# Patient Record
Sex: Male | Born: 1950 | Race: White | Hispanic: No | Marital: Married | Smoking: Never smoker
Health system: Southern US, Community
[De-identification: ages and names within clinical notes are randomized; demographics above are authoritative.]

## PROBLEM LIST (undated history)

## (undated) DIAGNOSIS — E119 Type 2 diabetes mellitus without complications: Secondary | ICD-10-CM

## (undated) DIAGNOSIS — T7840XA Allergy, unspecified, initial encounter: Secondary | ICD-10-CM

## (undated) DIAGNOSIS — I10 Essential (primary) hypertension: Secondary | ICD-10-CM

## (undated) DIAGNOSIS — J449 Chronic obstructive pulmonary disease, unspecified: Secondary | ICD-10-CM

## (undated) DIAGNOSIS — I509 Heart failure, unspecified: Secondary | ICD-10-CM

## (undated) DIAGNOSIS — M1990 Unspecified osteoarthritis, unspecified site: Secondary | ICD-10-CM

## (undated) DIAGNOSIS — Z5189 Encounter for other specified aftercare: Secondary | ICD-10-CM

## (undated) HISTORY — DX: Unspecified osteoarthritis, unspecified site: M19.90

## (undated) HISTORY — DX: Allergy, unspecified, initial encounter: T78.40XA

## (undated) HISTORY — PX: ROTATOR CUFF REPAIR: SHX139

## (undated) HISTORY — DX: Encounter for other specified aftercare: Z51.89

## (undated) HISTORY — DX: Heart failure, unspecified: I50.9

## (undated) HISTORY — DX: Chronic obstructive pulmonary disease, unspecified: J44.9

---

## 2000-04-03 ENCOUNTER — Ambulatory Visit (HOSPITAL_COMMUNITY): Admission: RE | Admit: 2000-04-03 | Discharge: 2000-04-03 | Payer: Self-pay | Admitting: Specialist

## 2000-04-03 ENCOUNTER — Encounter: Payer: Self-pay | Admitting: Specialist

## 2001-06-16 ENCOUNTER — Encounter: Admission: RE | Admit: 2001-06-16 | Discharge: 2001-09-14 | Payer: Self-pay | Admitting: Family Medicine

## 2005-12-03 ENCOUNTER — Encounter: Admission: RE | Admit: 2005-12-03 | Discharge: 2005-12-03 | Payer: Self-pay | Admitting: Family Medicine

## 2008-11-06 ENCOUNTER — Ambulatory Visit (HOSPITAL_COMMUNITY): Admission: RE | Admit: 2008-11-06 | Discharge: 2008-11-06 | Payer: Self-pay | Admitting: Urology

## 2009-02-21 ENCOUNTER — Encounter: Admission: RE | Admit: 2009-02-21 | Discharge: 2009-02-21 | Payer: Self-pay | Admitting: Family Medicine

## 2010-08-19 LAB — GLUCOSE, CAPILLARY: Glucose-Capillary: 167 mg/dL — ABNORMAL HIGH (ref 70–99)

## 2010-11-04 IMAGING — CR DG LUMBAR SPINE COMPLETE 4+V
5 series · 5 of 5 positions shown · non-contrast
Comparison: None

CLINICAL DATA: Low back and bilateral hip pain for several months,
no acute injury

LUMBAR SPINE - COMPLETE 4+ VIEW

[t l-spine a.p.]
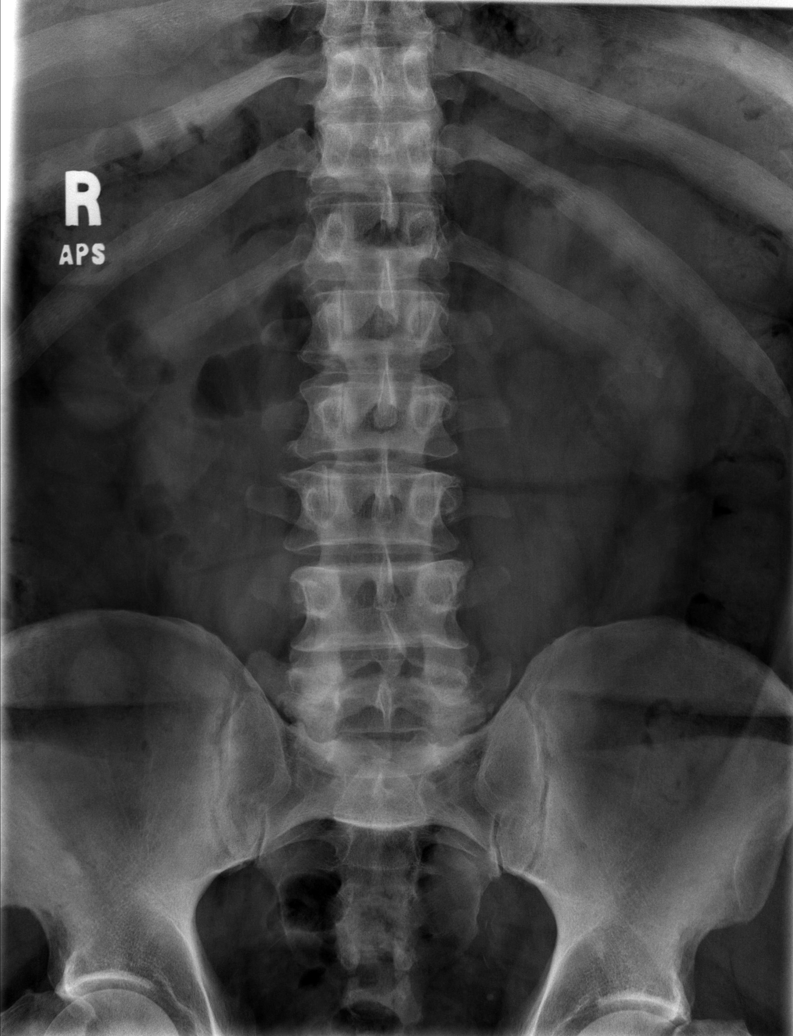

[t l-spine oblique exposure (1 of 2)]
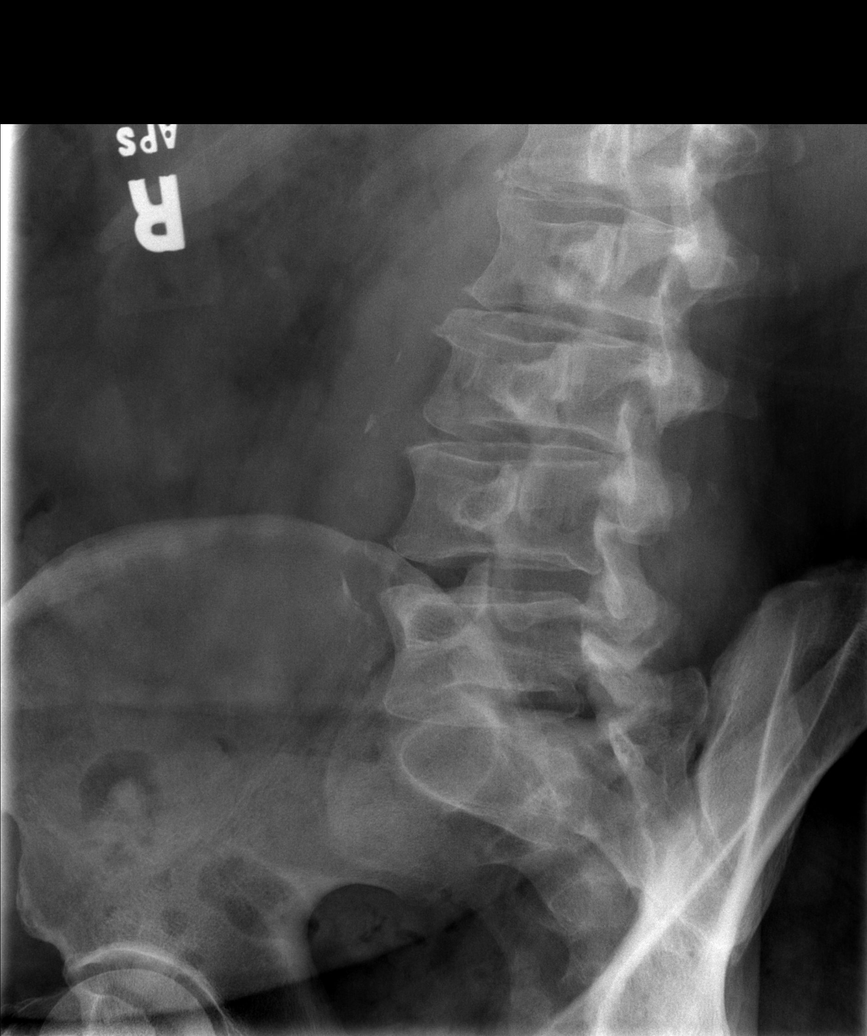

[t l-spine oblique exposure (2 of 2)]
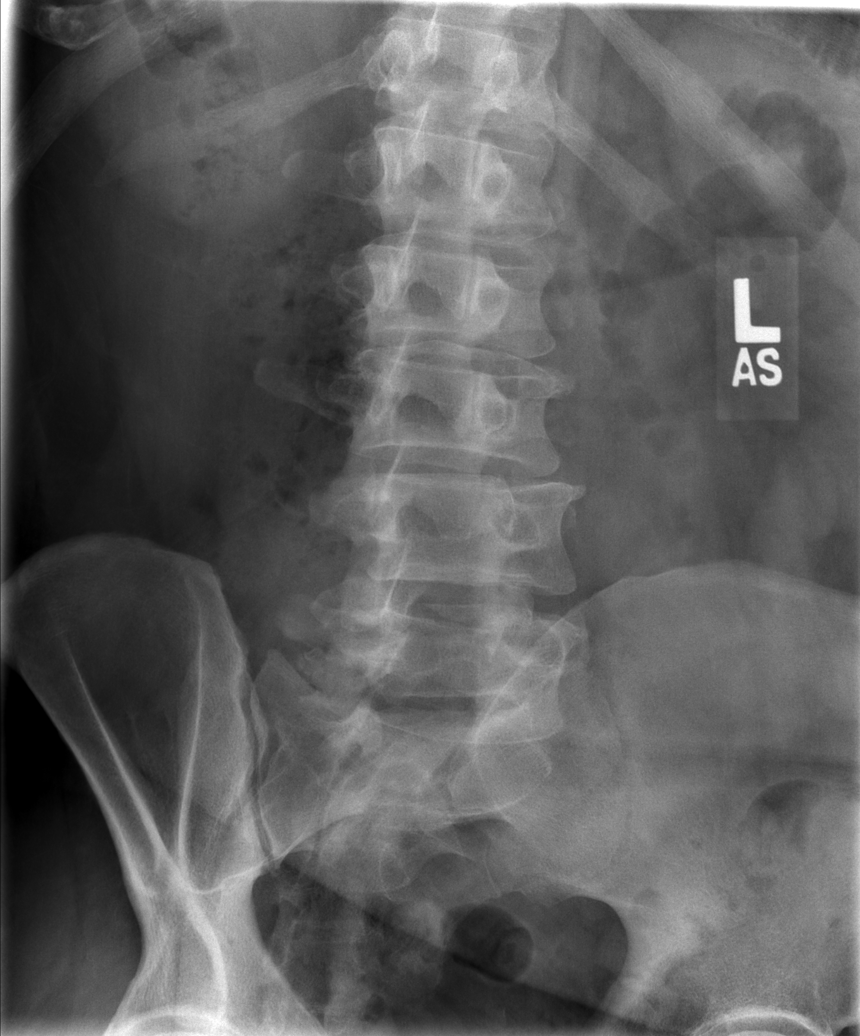

[t l-spine lat]
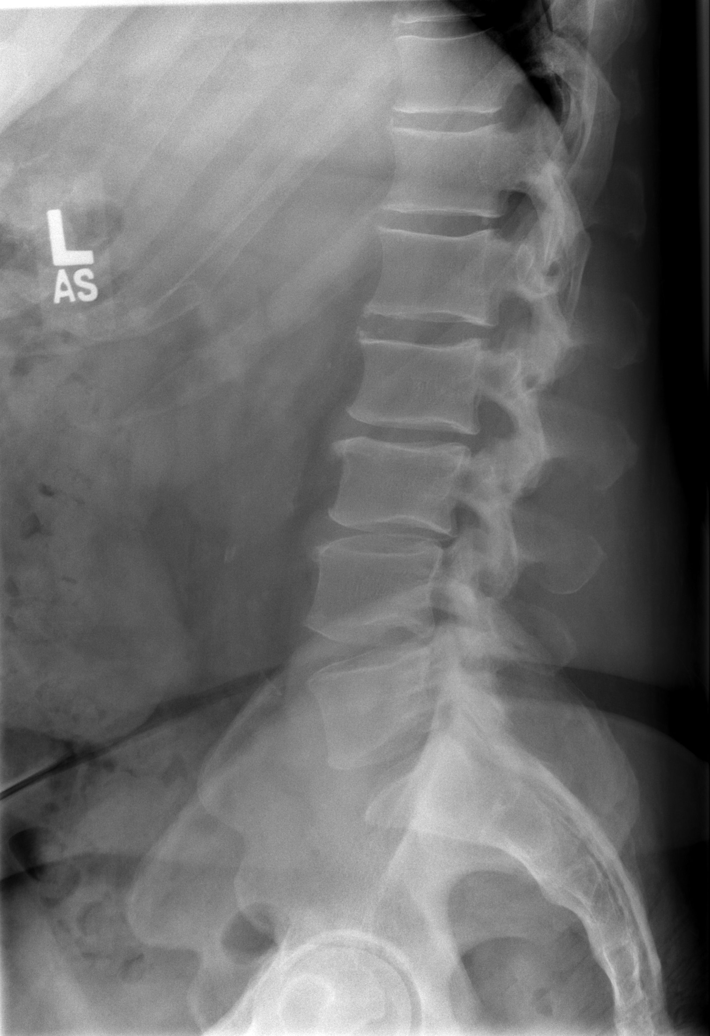

[t l-spine l5-s1 spot]
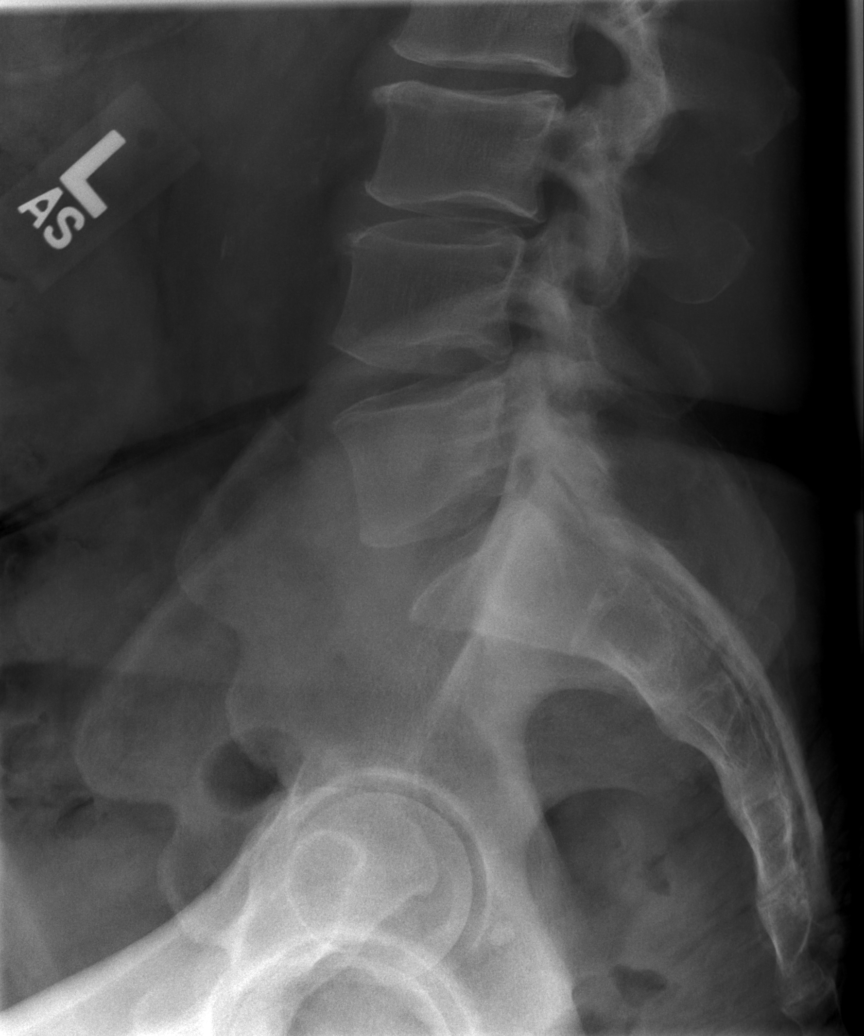

[5 of 5 positions shown; findings below may reference images not displayed]

FINDINGS: The lumbar vertebrae are slightly straightened in
alignment.  There is mild degenerative disc disease at L2-3 and L3-
4 levels with slight loss of disc space.  No compression deformity
is seen.  The SI joints appear normal.
IMPRESSION: Mild degenerative disc disease at L2-3 and L3-4.  Slightly
straightened alignment.

## 2011-06-10 ENCOUNTER — Other Ambulatory Visit: Payer: Self-pay | Admitting: Nephrology

## 2011-06-10 DIAGNOSIS — R7989 Other specified abnormal findings of blood chemistry: Secondary | ICD-10-CM

## 2011-06-13 ENCOUNTER — Ambulatory Visit
Admission: RE | Admit: 2011-06-13 | Discharge: 2011-06-13 | Disposition: A | Discharge status: Home / Self Care | Payer: BC Managed Care – PPO | Source: Ambulatory Visit | Attending: Nephrology | Admitting: Nephrology

## 2011-06-13 DIAGNOSIS — R7989 Other specified abnormal findings of blood chemistry: Secondary | ICD-10-CM

## 2012-08-02 ENCOUNTER — Emergency Department (HOSPITAL_COMMUNITY)
Admission: EM | Admit: 2012-08-02 | Discharge: 2012-08-02 | Disposition: A | Discharge status: Home / Self Care | Payer: BC Managed Care – PPO | Attending: Emergency Medicine | Admitting: Emergency Medicine

## 2012-08-02 ENCOUNTER — Emergency Department (HOSPITAL_COMMUNITY): Payer: BC Managed Care – PPO

## 2012-08-02 ENCOUNTER — Encounter (HOSPITAL_COMMUNITY): Payer: Self-pay | Admitting: Emergency Medicine

## 2012-08-02 DIAGNOSIS — Y92009 Unspecified place in unspecified non-institutional (private) residence as the place of occurrence of the external cause: Secondary | ICD-10-CM | POA: Insufficient documentation

## 2012-08-02 DIAGNOSIS — Z79899 Other long term (current) drug therapy: Secondary | ICD-10-CM | POA: Insufficient documentation

## 2012-08-02 DIAGNOSIS — S46909A Unspecified injury of unspecified muscle, fascia and tendon at shoulder and upper arm level, unspecified arm, initial encounter: Secondary | ICD-10-CM | POA: Insufficient documentation

## 2012-08-02 DIAGNOSIS — Z794 Long term (current) use of insulin: Secondary | ICD-10-CM | POA: Insufficient documentation

## 2012-08-02 DIAGNOSIS — Z7982 Long term (current) use of aspirin: Secondary | ICD-10-CM | POA: Insufficient documentation

## 2012-08-02 DIAGNOSIS — E119 Type 2 diabetes mellitus without complications: Secondary | ICD-10-CM | POA: Insufficient documentation

## 2012-08-02 DIAGNOSIS — W010XXA Fall on same level from slipping, tripping and stumbling without subsequent striking against object, initial encounter: Secondary | ICD-10-CM | POA: Insufficient documentation

## 2012-08-02 DIAGNOSIS — Y9301 Activity, walking, marching and hiking: Secondary | ICD-10-CM | POA: Insufficient documentation

## 2012-08-02 DIAGNOSIS — M25511 Pain in right shoulder: Secondary | ICD-10-CM

## 2012-08-02 DIAGNOSIS — I10 Essential (primary) hypertension: Secondary | ICD-10-CM | POA: Insufficient documentation

## 2012-08-02 DIAGNOSIS — S4980XA Other specified injuries of shoulder and upper arm, unspecified arm, initial encounter: Secondary | ICD-10-CM | POA: Insufficient documentation

## 2012-08-02 HISTORY — DX: Essential (primary) hypertension: I10

## 2012-08-02 HISTORY — DX: Type 2 diabetes mellitus without complications: E11.9

## 2012-08-02 MED ORDER — HYDROCODONE-ACETAMINOPHEN 5-325 MG PO TABS: 2.0000 | ORAL_TABLET | ORAL | Status: AC | PRN

## 2012-08-02 NOTE — Progress Notes (Signed)
Orthopedic Tech Progress Note Patient Details:  Danny Villarreal 05-19-1950 [id number] Arm sling applied to Right UE. Tolerated well.  Ortho Devices Type of Ortho Device: Arm sling Ortho Device/Splint Location: Right Ortho Device/Splint Interventions: Application   Asia R Thompson 08/02/2012, 9:36 AM

## 2012-08-02 NOTE — ED Notes (Signed)
Was walking down ramp at home and slipped, falling on rt elbow and shoulder.  Pt can life shoulder with pain.  Pt alert X4.  Pain 10/10 with movement without movement pt denies pain.

## 2012-08-02 NOTE — ED Notes (Signed)
Pt sts slipped and fell onto right shoulder last night and still having pain

## 2012-08-02 NOTE — ED Notes (Signed)
RN called ortho for arm sling

## 2012-08-02 NOTE — ED Provider Notes (Signed)
History     CSN: [id number]  Arrival date & time 08/02/12  0724   First MD Initiated Contact with Patient 08/02/12 0913      Chief Complaint  Patient presents with  . Fall    (Consider location/radiation/quality/duration/timing/severity/associated sxs/prior treatment) HPI Comments: This is a 62 year old male who slipped and fell on a ramp last night. He fell on his elbow and is now having non radiating sharp shoulder pain with movement. No pain with rest. Pain is worse with abduction. No pain in forearm, elbow. No tingling or numbness. Internal and external rotation of the elbow causes no pain. No LOC during fall. No headache, abdominal pain, nausea, vomiting.  Patient is a 62 y.o. male presenting with shoulder pain. The history is provided by the patient. No language interpreter was used.  Shoulder Pain This is a new problem. The current episode started yesterday. The problem occurs constantly. The problem has been gradually worsening. Associated symptoms include arthralgias. Pertinent negatives include no abdominal pain, fatigue, fever, headaches, joint swelling, nausea or numbness. Exacerbated by: movement. He has tried nothing for the symptoms.    Past Medical History  Diagnosis Date  . Hypertension   . Diabetes mellitus without complication     History reviewed. No pertinent past surgical history.  History reviewed. No pertinent family history.  History  Substance Use Topics  . Smoking status: Never Smoker   . Smokeless tobacco: Not on file  . Alcohol Use: No      Review of Systems  Constitutional: Negative for fever and fatigue.  Gastrointestinal: Negative for nausea and abdominal pain.  Musculoskeletal: Positive for arthralgias. Negative for joint swelling.  Neurological: Negative for numbness and headaches.  All other systems reviewed and are negative.    Allergies  Penicillins  Home Medications   Current Outpatient Rx  Name  Route  Sig  Dispense  Refill   . amLODipine-benazepril (LOTREL) 5-20 MG per capsule   Oral   Take 1 capsule by mouth 2 (two) times daily.         Marland Kitchen aspirin 81 MG tablet   Oral   Take 81 mg by mouth daily.         . colchicine 0.6 MG tablet   Oral   Take 0.6 mg by mouth daily as needed (gout flare up.).         Marland Kitchen Febuxostat (ULORIC) 80 MG TABS   Oral   Take 1 tablet by mouth at bedtime.         . furosemide (LASIX) 20 MG tablet   Oral   Take 20 mg by mouth daily.         Marland Kitchen GLUCOSAMINE-CHONDROITIN PO   Oral   Take 2 tablets by mouth at bedtime.         . insulin glargine (LANTUS SOLOSTAR) 100 UNIT/ML injection   Subcutaneous   Inject 54 Units into the skin daily with breakfast.         . lansoprazole (PREVACID) 30 MG capsule   Oral   Take 30 mg by mouth daily.         . Multiple Vitamin (MULTIVITAMIN WITH MINERALS) TABS   Oral   Take 1 tablet by mouth daily.         . saxagliptin HCl (ONGLYZA) 2.5 MG TABS tablet   Oral   Take 2.5 mg by mouth at bedtime.         . simvastatin (ZOCOR) 20 MG tablet  Oral   Take 20 mg by mouth daily.           BP 146/75  Pulse 69  Temp(Src) 98.1 F (36.7 C) (Oral)  Resp 18  SpO2 97%  Physical Exam  Nursing note and vitals reviewed. Constitutional: He is oriented to person, place, and time. Vital signs are normal. He appears well-developed and well-nourished. He does not have a sickly appearance. No distress.  HENT:  Head: Normocephalic and atraumatic.  Right Ear: External ear normal.  Left Ear: External ear normal.  Nose: Nose normal.  Eyes: Conjunctivae and lids are normal.  Neck: Trachea normal, normal range of motion and phonation normal. No tracheal deviation present.  Cardiovascular: Normal rate, regular rhythm, normal heart sounds, intact distal pulses and normal pulses.  Exam reveals no gallop and no friction rub.   No murmur heard. Pulmonary/Chest: Effort normal and breath sounds normal. No stridor.  Abdominal: Normal  appearance. He exhibits no distension.  Musculoskeletal: He exhibits tenderness. He exhibits no edema.       Right shoulder: He exhibits decreased range of motion, tenderness and pain. He exhibits no deformity and normal pulse.       Right elbow: Normal. ROM in elbow intact, no tenderness to palpation in elbow - internal and external rotation WNL Radial pulses intact, no numbness  Neurological: He is alert and oriented to person, place, and time. He displays no atrophy.  Skin: Skin is warm and dry. He is not diaphoretic. No erythema.  Psychiatric: He has a normal mood and affect. His behavior is normal.    ED Course  Procedures (including critical care time)  Labs Reviewed - No data to display Dg Shoulder Right  08/02/2012  *RADIOLOGY REPORT*  Clinical Data: Fall  RIGHT SHOULDER - 2+ VIEW  Comparison: None.  Findings: Humeral bone anchor in place. The internal rotation view is optimal. AC distance is normal allowing for technique. The right lung is clear.  IMPRESSION: No acute osseous abnormality of the right shoulder.   Original Report Authenticated By: Christiana Pellant, M.D.      1. Shoulder pain, acute, right       MDM  Patient presents after a fall last night with shoulder pain. XR negative for fracture. Radial pulse intact, no numbness. No erythema surrounding joint. Patient denied pain medication in ED. A sling was given for comfort until he can get ortho appointment. Will make appointment today. Given Norco for pain control at home. Return precautions given. Patient / Family / Caregiver understand and agree with initial ED impression and plan with expectations set for ED visit.        Mora Bellman, PA-C 08/02/12 2126

## 2012-08-03 NOTE — ED Provider Notes (Signed)
Medical screening examination/treatment/procedure(s) were performed by non-physician practitioner and as supervising physician I was immediately available for consultation/collaboration.   Suzi Roots, MD 08/03/12 [phone]

## 2014-03-13 ENCOUNTER — Ambulatory Visit: Payer: Self-pay

## 2014-03-16 ENCOUNTER — Ambulatory Visit (INDEPENDENT_AMBULATORY_CARE_PROVIDER_SITE_OTHER): Payer: BC Managed Care – PPO

## 2014-03-16 DIAGNOSIS — M778 Other enthesopathies, not elsewhere classified: Secondary | ICD-10-CM

## 2014-03-16 DIAGNOSIS — M722 Plantar fascial fibromatosis: Secondary | ICD-10-CM

## 2014-03-16 DIAGNOSIS — M2022 Hallux rigidus, left foot: Secondary | ICD-10-CM

## 2014-03-16 DIAGNOSIS — R52 Pain, unspecified: Secondary | ICD-10-CM

## 2014-03-16 DIAGNOSIS — M7752 Other enthesopathy of left foot: Secondary | ICD-10-CM

## 2014-03-16 DIAGNOSIS — M779 Enthesopathy, unspecified: Secondary | ICD-10-CM

## 2014-03-16 NOTE — Progress Notes (Signed)
   Subjective:    Patient ID: Danny Villarreal, male    DOB: 20-Oct-1950, 63 y.o.   MRN: [id number]  HPI  PT STATED LT FOOT GREAT TOE JOINT IS PAINFUL FOR 4 MONTHS. LT FOOT HAVE HISTORY OF GOUT AND THE TOE IS NOT GETTER WORSE IS BEEN THE SAME. THE TOE GET AGGRAVATED BY FLEXING IT. TRIED TYLENOL FOR PAIN AND IT HELP SOME.  ALSO, 'QUESTION ABOUT ORTHOTICS.  Review of Systems  All other systems reviewed and are negative.      Objective:   Physical Exam 63 year old white male well-developed well-nourished oriented 3 presents at this time with orthotics from 7 or 8 years ago has this is his orthotics are worn 15 years total knee is having some issues with his left foot first MTP joint has a history of gout attacks however has chronic pain and stiffness of his left great toe joint consistent with degenerative or osteoarthritic changes as well as gouty arthropathy flareups at times. Lower extremity objective findings reveal vascular status to be intact pedal pulses are palpable DP and PT +2 over 4 bilateral capillary fill time 3 seconds all digits. Epicritic and proprioceptive sensations intact and symmetric bilateral there is normal plantar response and DTRs noted. The skin color pigment normal hair growth absent mild varicosities noted there is keratosis incurvation and friability of nails noted orthopedic exam promontory changes are noted there is limited range of motion dorsal flexion plantar flexion of the left great toe with dorsal spurring and osteophyte palpated there is adequate range of motion on the right great toe joint proximal the 60 dorsiflexion versus 3530-35 dorsiflexion on the left. There is tenderness of the plantar fascial area presents orthotics are worn and delaminating in the replacement at this time. Remainder of exam is otherwise unremarkable and noncontributory fasciitis of the managed with orthoses for more than 15 years. 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 She has callusing at the IP joint of the hallux due to the rigidus which should also resolve with orthoses will do a Spenco top cover and may actually recover his old orthotics with Spenco in the future as well follow-up with in one month next  Alvan Dameichard Keiana Tavella DPM

## 2014-03-16 NOTE — Patient Instructions (Signed)
\Hallux Rigidus Hallux rigidus is a condition involving pain and a loss of motion of the first (big) toe. The pain gets worse with lifting up (extension) of the toe. This is usually due to arthritic bony bumps (spurring) of the joint at the base of the big toe.  SYMPTOMS   Pain, with lifting up of the toe.  Tenderness over the joint where the big toe meets the foot.  Redness, swelling, and warmth over the top of the base of the big toe (sometimes).  Foot pain, stiffness, and limping. CAUSES  Hallux rigidus is caused by arthritis of the joint where the big toe meets the foot. The arthritis creates a bone spur that pinches the soft tissues when the toe is extended. RISK INCREASES WITH:  Tight shoes with a narrow toe box.  Family history of foot problems.  Gout and rheumatoid and psoriatic arthritis.  History of previous toe injury, including "turf toe."  Long first toe, flat feet, and other big toe bony bumps.  Arthritis of the big toe. PREVENTION   Wear wide-toed shoes that fit well.  Tape the big toe to reduce motion and to prevent pinching of the tissues between the bone.  Maintain physical fitness:  Foot and ankle flexibility.  Muscle strength and endurance. PROGNOSIS  This condition can usually be managed with proper treatment. However, surgery is typically required to prevent the problem from recurring.  RELATED COMPLICATIONS  Injury to other areas of the foot or ankle, caused by abnormal walking in an attempt to avoid the pain felt when walking normally. TREATMENT Treatment first involves stopping the activities that aggravate your symptoms. Ice and medicine can be used to reduce the pain and inflammation. Modifications to shoes may help reduce pain, including wearing stiff-soled shoes, shoes with a wide toe box, inserting a padded donut to relieve pressure on top of the joint, or wearing an arch support. Corticosteroid injections may be given to reduce inflammation.  If nonsurgical treatment is unsuccessful, surgery may be needed. Surgical options include removing the arthritic bony spur, cutting a bone in the foot to change the arc of motion (allowing the toe to extend more), or fusion of the joint (eliminating all motion in the joint at the base of the big toe).  MEDICATION   If pain medicine is needed, nonsteroidal anti-inflammatory medicines (aspirin and ibuprofen), or other minor pain relievers (acetaminophen), are often advised.  Do not take pain medicine for 7 days before surgery.  Prescription pain relievers are usually prescribed only after surgery. Use only as directed and only as much as you need.  Ointments for arthritis, applied to the skin, may give some relief.  Injections of corticosteroids may be given to reduce inflammation. HEAT AND COLD  Cold treatment (icing) relieves pain and reduces inflammation. Cold treatment should be applied for 10 to 15 minutes every 2 to 3 hours, and immediately after activity that aggravates your symptoms. Use ice packs or an ice massage.  Heat treatment may be used before performing the stretching and strengthening activities prescribed by your caregiver, physical therapist, or athletic trainer. Use a heat pack or a warm water soak. SEEK MEDICAL CARE IF:   Symptoms get worse or do not improve in 2 weeks, despite treatment.  After surgery you develop fever, increasing pain, redness, swelling, drainage of fluids, bleeding, or increasing warmth.  New, unexplained symptoms develop. (Drugs used in treatment may produce side effects.) Document Released: 04/28/2005 Document Revised: 09/12/2013 Document Reviewed: 08/10/2008 Exit[employer] Patient Information   2015 [employer]. This information is not intended to replace advice given to you by your health care provider. Make sure you discuss any questions you have with your health care provider.  

## 2014-03-23 ENCOUNTER — Other Ambulatory Visit: Payer: Self-pay | Admitting: Family Medicine

## 2014-03-23 ENCOUNTER — Ambulatory Visit (INDEPENDENT_AMBULATORY_CARE_PROVIDER_SITE_OTHER): Payer: BC Managed Care – PPO

## 2014-03-23 ENCOUNTER — Ambulatory Visit (INDEPENDENT_AMBULATORY_CARE_PROVIDER_SITE_OTHER): Payer: BC Managed Care – PPO | Admitting: Family Medicine

## 2014-03-23 VITALS — BP 146/68 | HR 84 | Temp 98.2°F | Resp 16 | Ht 70.5 in | Wt 274.4 lb

## 2014-03-23 DIAGNOSIS — S8011XA Contusion of right lower leg, initial encounter: Secondary | ICD-10-CM
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
## 2014-03-23 NOTE — Patient Instructions (Signed)
Elevated and ice your leg when you are able. Continue tylenol as needed Let me know if you are not getting better over the next couple of weeks- Sooner if worse.

## 2014-05-22 ENCOUNTER — Telehealth: Payer: Self-pay | Admitting: *Deleted

## 2014-05-22 NOTE — Telephone Encounter (Signed)
"  I'm calling for [employer] Vollman.  He's been trying to get in touch with someone from that office.  He's waiting for inserts.  You keep calling his home number.  If you could please call him at [phone]."

## 2014-05-22 NOTE — Telephone Encounter (Signed)
Danny BlanksKrystle called patient on his cell phone and gave the back line phone number [phone]. Patient has an appt on Thursday 05/25/14. Danny StanleyLisa

## 2014-05-25 ENCOUNTER — Ambulatory Visit: Payer: Self-pay

## 2014-05-25 ENCOUNTER — Ambulatory Visit (INDEPENDENT_AMBULATORY_CARE_PROVIDER_SITE_OTHER): Payer: BLUE CROSS/BLUE SHIELD

## 2014-05-25 DIAGNOSIS — M722 Plantar fascial fibromatosis: Secondary | ICD-10-CM

## 2014-05-25 NOTE — Patient Instructions (Signed)
Patient picked up inserts and given instructions. Danny Villarreal

## 2014-05-25 NOTE — Progress Notes (Signed)
   Subjective:    Patient ID: Danny Villarreal, male    DOB: 07-Mar-1951, 64 y.o.   MRN: [id number]  HPI I am here to get my inserts    Review of Systems no new findings or systemic changes noted     Objective:   Physical Exam Neurovascular status is intact pedal pulses are palpable epicritic and proprioceptive sensations intact. Continues to have pain on palpation medial band plantar fascia medial calcaneal tubercle area however this time orthotics are dispensed with break in wearing instructions a fit and contour well full contact both feet patient is given oral and written instructions for use and break in period       Assessment & Plan:  Assessment plantar fasciitis/heel spur syndrome orthotics dispensed with break in instructions follow-up in one to 2 months for adjustments as needed.  Alvan Dameichard Kaylynn Chamblin DPM

## 2014-06-19 ENCOUNTER — Ambulatory Visit (INDEPENDENT_AMBULATORY_CARE_PROVIDER_SITE_OTHER): Payer: BLUE CROSS/BLUE SHIELD

## 2014-06-19 VITALS — BP 139/74 | HR 66 | Resp 18

## 2014-06-19 DIAGNOSIS — R52 Pain, unspecified: Secondary | ICD-10-CM

## 2014-06-19 DIAGNOSIS — M779 Enthesopathy, unspecified: Secondary | ICD-10-CM

## 2014-06-19 DIAGNOSIS — M2022 Hallux rigidus, left foot: Secondary | ICD-10-CM

## 2014-06-19 DIAGNOSIS — M722 Plantar fascial fibromatosis: Secondary | ICD-10-CM

## 2014-06-19 DIAGNOSIS — M778 Other enthesopathies, not elsewhere classified: Secondary | ICD-10-CM

## 2014-06-19 DIAGNOSIS — M7752 Other enthesopathy of left foot: Secondary | ICD-10-CM

## 2014-06-19 NOTE — Progress Notes (Signed)
   Subjective:    Patient ID: Danny Villarreal, male    DOB: 1951-04-28, 64 y.o.   MRN: [id number]  HPI MY INSERT ON MY LEFT  FOOT IS HURTING ON THE ARCH OF MY FOOT    Review of Systems no new findings or systemic changes noted    Objective:   Physical Exam patient presents this time for follow-up and orthotic use orthotics are dispensed that 3 weeks ago causing some irritation to the mid arch left foot. Patient has hallux rigidus capsulitis first MTP area left however the mid arch of the foot which is flattening considerably is getting some irritation from the new orthotic actually orthotic fits and contour as well however he may not be used to the support in the arch is his old orthotics are worn and flattened down. At this time patient is advised needs to give it a month or 2 to adjusted in the orthotic there is a is time and patient will try been as [place] shoes and on trying he felt Danny Villarreal more comfortable that she didn't causing irritation once I did make a slight adjustment to the orthotic      Assessment & Plan:  Assessment persistent plantar fasciitis/heel spur syndrome as well as capsulitis in and hallux rigidus of left foot. Plan at this time orthotics adjusted no charge to the patient reappointed in a month or 2 for additional adjustments may be needed next  Danny Villarreal DPM

## 2014-06-19 NOTE — Patient Instructions (Signed)

## 2014-07-06 ENCOUNTER — Ambulatory Visit (INDEPENDENT_AMBULATORY_CARE_PROVIDER_SITE_OTHER): Payer: BLUE CROSS/BLUE SHIELD

## 2014-07-06 VITALS — BP 129/69 | HR 70 | Resp 18

## 2014-07-06 DIAGNOSIS — M2022 Hallux rigidus, left foot: Secondary | ICD-10-CM

## 2014-07-06 DIAGNOSIS — M779 Enthesopathy, unspecified: Secondary | ICD-10-CM

## 2014-07-06 DIAGNOSIS — M722 Plantar fascial fibromatosis: Secondary | ICD-10-CM

## 2014-07-06 DIAGNOSIS — M7752 Other enthesopathy of left foot: Secondary | ICD-10-CM

## 2014-07-06 DIAGNOSIS — M778 Other enthesopathies, not elsewhere classified: Secondary | ICD-10-CM

## 2014-07-06 NOTE — Progress Notes (Signed)
   Subjective:    Patient ID: Danny Villarreal, male    DOB: 07-06-1950, 64 y.o.   MRN: [id number]  HPI I AM DOING BETTER WITH THE INSERTS AND THEY FEEL LIKE THEY RUB ON THE 5TH TOES    Review of Systems no new findings or systemic changes     Objective:   Physical Exam Neurovascular status intact pedal pulses palpable patient does have some capsulitis and rigidus of the left great toe with some callusing however is accommodating for that self debriding as needed at some point may need to have to address the arthritic joint however this time managing it with appropriate shoes orthoses and occasionally Tylenol for pain medication. At this time the orthotics have adapted her patient has adjusted more to the orthotics they seem to fit and contour well to the future on an as-needed basis at this time no significant symptomology or fascial pain or capsular pain the orthotics and be functional and better after minor adjustment adjusted follow-up in the future on an as-needed basis if there is any re-exacerbation or any new issues develop       Assessment & Plan:  Assessment orthotics functioning well at this time continue to adjust patient's lateral toes are rubbing on his shoes but this may be more of function of his feet or spreading and widening with age rather than the orthoses causing issue orthotics fit and contour well he will likely need to do a shoe adjustments far shoe size or fit follow-up in the future when needed  Alvan Dameichard Glenys Snader DPM

## 2016-08-11 ENCOUNTER — Encounter: Payer: Self-pay | Admitting: Family Medicine

## 2016-08-11 LAB — HM DIABETES EYE EXAM

## 2019-06-06 ENCOUNTER — Ambulatory Visit: Payer: BLUE CROSS/BLUE SHIELD | Attending: Internal Medicine

## 2019-06-06 DIAGNOSIS — Z23 Encounter for immunization: Secondary | ICD-10-CM | POA: Insufficient documentation

## 2019-06-06 NOTE — Progress Notes (Signed)
   Covid-19 Vaccination Clinic  Name:  Danny Villarreal    MRN: [id number] DOB: 07/16/1950  06/06/2019  [name] was observed post Covid-19 immunization for 15 minutes without incidence. He was provided with Vaccine Information Sheet and instruction to access the V-Safe system.   [name] was instructed to call 911 with any severe reactions post vaccine: Marland Kitchen Difficulty breathing  . Swelling of your face and throat  . A fast heartbeat  . A bad rash all over your body  . Dizziness and weakness    Immunizations Administered    Name Date Dose VIS Date Route   Pfizer COVID-19 Vaccine 06/06/2019  6:00 PM 0.3 mL 04/22/2019 Intramuscular   Manufacturer: [employer], [employer]   Lot: [id number]   NDC: [address]-1

## 2019-06-27 ENCOUNTER — Ambulatory Visit: Payer: BLUE CROSS/BLUE SHIELD | Attending: Internal Medicine

## 2019-06-27 DIAGNOSIS — Z23 Encounter for immunization: Secondary | ICD-10-CM

## 2019-06-27 NOTE — Progress Notes (Signed)
   Covid-19 Vaccination Clinic  Name:  Danny Villarreal    MRN: [id number] DOB: Nov 26, 1950  06/27/2019  [name] was observed post Covid-19 immunization for 15 minutes without incidence. He was provided with Vaccine Information Sheet and instruction to access the V-Safe system.   [name] was instructed to call 911 with any severe reactions post vaccine: Marland Kitchen Difficulty breathing  . Swelling of your face and throat  . A fast heartbeat  . A bad rash all over your body  . Dizziness and weakness    Immunizations Administered    Name Date Dose VIS Date Route   Pfizer COVID-19 Vaccine 06/27/2019  1:09 PM 0.3 mL 04/22/2019 Intramuscular   Manufacturer: [employer], [employer]   Lot: [id number]   NDC: [address]-3

## 2020-02-07 ENCOUNTER — Ambulatory Visit: Payer: BLUE CROSS/BLUE SHIELD

## 2020-02-14 ENCOUNTER — Ambulatory Visit: Payer: BLUE CROSS/BLUE SHIELD | Attending: Internal Medicine

## 2020-02-14 DIAGNOSIS — Z23 Encounter for immunization: Secondary | ICD-10-CM

## 2020-02-14 NOTE — Progress Notes (Signed)
   Covid-19 Vaccination Clinic  Name:  Danny Villarreal    MRN: [id number] DOB: Jul 20, 1950  02/14/2020  Danny Villarreal was observed post Covid-19 immunization for 15 minutes without incident. He was provided with Vaccine Information Sheet and instruction to access the V-Safe system.   Danny Villarreal was instructed to call 911 with any severe reactions post vaccine: Marland Kitchen Difficulty breathing  . Swelling of face and throat  . A fast heartbeat  . A bad rash all over body  . Dizziness and weakness

## 2020-11-29 ENCOUNTER — Ambulatory Visit: Payer: BLUE CROSS/BLUE SHIELD

## 2020-12-03 ENCOUNTER — Other Ambulatory Visit (HOSPITAL_BASED_OUTPATIENT_CLINIC_OR_DEPARTMENT_OTHER): Payer: Self-pay

## 2020-12-03 ENCOUNTER — Ambulatory Visit: Payer: BLUE CROSS/BLUE SHIELD | Attending: Internal Medicine

## 2020-12-03 ENCOUNTER — Other Ambulatory Visit: Payer: Self-pay

## 2020-12-03 DIAGNOSIS — Z23 Encounter for immunization: Secondary | ICD-10-CM

## 2020-12-03 MED ORDER — PFIZER-BIONT COVID-19 VAC-TRIS 30 MCG/0.3ML IM SUSP
INTRAMUSCULAR | 0 refills | Status: AC
  Filled 2020-12-03: qty 0.3, 1d supply, fill #0

## 2020-12-03 NOTE — Progress Notes (Signed)
   Covid-19 Vaccination Clinic  Name:  Danny Villarreal    MRN: [id number] DOB: 09-22-1950  12/03/2020  Mr. Christiana was observed post Covid-19 immunization for 15 minutes without incident. He was provided with Vaccine Information Sheet and instruction to access the V-Safe system.   Mr. Bomberger was instructed to call 911 with any severe reactions post vaccine: Difficulty breathing  Swelling of face and throat  A fast heartbeat  A bad rash all over body  Dizziness and weakness   Immunizations Administered     Name Date Dose VIS Date Route   PFIZER Comrnaty(Gray TOP) Covid-19 Vaccine 12/03/2020  3:24 PM 0.3 mL 04/19/2020 Intramuscular   Manufacturer: [employer], [employer]   Lot: [id number]   NDC: [phone]

## 2021-03-14 ENCOUNTER — Other Ambulatory Visit (HOSPITAL_BASED_OUTPATIENT_CLINIC_OR_DEPARTMENT_OTHER): Payer: Self-pay

## 2021-03-14 ENCOUNTER — Ambulatory Visit: Payer: BLUE CROSS/BLUE SHIELD | Attending: Internal Medicine

## 2021-03-14 DIAGNOSIS — Z23 Encounter for immunization: Secondary | ICD-10-CM

## 2021-03-14 MED ORDER — PFIZER COVID-19 VAC BIVALENT 30 MCG/0.3ML IM SUSP
INTRAMUSCULAR | 0 refills | Status: AC
  Filled 2021-03-14: qty 0.3, 1d supply, fill #0

## 2021-03-14 NOTE — Progress Notes (Signed)
   Covid-19 Vaccination Clinic  Name:  Danny Villarreal    MRN: [id number] DOB: [birth year]  03/14/2021  Mr. Doggett was observed post Covid-19 immunization for 15 minutes15 minutes without incident. He was provided with Vaccine Information Sheet and instruction to access the V-Safe system.   Mr. Powe was instructed to call 911 with any severe reactions post vaccine: Difficulty breathing  Swelling of face and throat  A fast heartbeat  A bad rash all over body  Dizziness and weakness   Immunizations Administered     Name Date Dose VIS Date Route   Pfizer Covid-19 Vaccine Bivalent Booster 03/14/2021  1:11 PM 0.3 mL 01/09/2021 Intramuscular   Manufacturer: [employer], [employer]   Lot: [id number]   NDC: [phone]

## 2021-03-28 ENCOUNTER — Ambulatory Visit: Payer: BLUE CROSS/BLUE SHIELD

## 2021-04-02 ENCOUNTER — Other Ambulatory Visit (HOSPITAL_BASED_OUTPATIENT_CLINIC_OR_DEPARTMENT_OTHER): Payer: Self-pay

## 2021-06-18 DIAGNOSIS — N1832 Chronic kidney disease, stage 3b: Secondary | ICD-10-CM | POA: Diagnosis not present

## 2021-06-18 DIAGNOSIS — E1129 Type 2 diabetes mellitus with other diabetic kidney complication: Secondary | ICD-10-CM | POA: Diagnosis not present

## 2021-06-18 DIAGNOSIS — I10 Essential (primary) hypertension: Secondary | ICD-10-CM | POA: Diagnosis not present

## 2021-06-18 DIAGNOSIS — E875 Hyperkalemia: Secondary | ICD-10-CM | POA: Diagnosis not present

## 2021-08-14 DIAGNOSIS — M109 Gout, unspecified: Secondary | ICD-10-CM | POA: Diagnosis not present

## 2021-08-14 DIAGNOSIS — E1122 Type 2 diabetes mellitus with diabetic chronic kidney disease: Secondary | ICD-10-CM | POA: Diagnosis not present

## 2021-08-14 DIAGNOSIS — N1832 Chronic kidney disease, stage 3b: Secondary | ICD-10-CM | POA: Diagnosis not present

## 2021-08-14 DIAGNOSIS — M65321 Trigger finger, right index finger: Secondary | ICD-10-CM | POA: Diagnosis not present

## 2021-10-05 ENCOUNTER — Ambulatory Visit (HOSPITAL_COMMUNITY)
Admission: EM | Admit: 2021-10-05 | Discharge: 2021-10-05 | Disposition: A | Discharge status: Home / Self Care | Payer: BC Managed Care – PPO | Attending: Physician Assistant | Admitting: Physician Assistant

## 2021-10-05 ENCOUNTER — Encounter (HOSPITAL_COMMUNITY): Payer: Self-pay | Admitting: Emergency Medicine

## 2021-10-05 DIAGNOSIS — R0981 Nasal congestion: Secondary | ICD-10-CM

## 2021-10-05 DIAGNOSIS — J40 Bronchitis, not specified as acute or chronic: Secondary | ICD-10-CM

## 2021-10-05 DIAGNOSIS — R062 Wheezing: Secondary | ICD-10-CM

## 2021-10-05 DIAGNOSIS — J069 Acute upper respiratory infection, unspecified: Secondary | ICD-10-CM

## 2021-10-05 MED ORDER — ALBUTEROL SULFATE HFA 108 (90 BASE) MCG/ACT IN AERS
2.0000 | INHALATION_SPRAY | Freq: Once | RESPIRATORY_TRACT | Status: AC
  Administered 2021-10-05: 2 via RESPIRATORY_TRACT

## 2021-10-05 MED ORDER — ALBUTEROL SULFATE HFA 108 (90 BASE) MCG/ACT IN AERS
INHALATION_SPRAY | RESPIRATORY_TRACT | Status: AC
  Filled 2021-10-05: qty 6.7

## 2021-10-05 NOTE — Discharge Instructions (Addendum)
Advised to use Flonase nasal spray 2 sprays each nostril once a day to help decrease the congestion. Advised to use Mucinex DM to help control the cough. Advised to use the albuterol inhaler 2 puffs every 4-6 hours as needed for chest congestion and wheezing. Increase fluid intake, watch glucose levels, and follow-up with PCP or return to urgent care for evaluation if symptoms fail to improve over the next 5 to 7 days.

## 2021-10-05 NOTE — ED Triage Notes (Signed)
Pt reports cough, nasal congestion and sore throat from post nasal drip x 5 days. States have tried OTC Nyquil, Dayquil, Mucinex, flonase and allegra with no relief.

## 2021-10-05 NOTE — ED Provider Notes (Signed)
[facility]    CSN: [id number] Arrival date & time: 10/05/21  1001      History   Chief Complaint Chief Complaint  Patient presents with   Cough   Nasal Congestion   Sore Throat    HPI Danny Villarreal is a 71 y.o. male.   79-year-old male presents with cough, congestion, upper respiratory congestion.  Patient relates for the past 5 days has had persistent upper respiratory type symptoms with sinus congestion, nasal congestion, and postnasal drip.  Patient relates that congestion has been clear.  Patient relates he did go to a reunion about a week ago where he mingle with a lot of different people for several days, and then his symptoms started afterwards.  Patient relates he has not had fever, but he has done 2 COVID tests and they have been negative with the last one being yesterday.  Patient indicates he has also had some chest congestion, with cough, intermittent, clear production, with associated wheezing and mild shortness of breath.  Patient patient relates he does have mild fatigue with activity but he relates this to his congestion.  No fever or chills, patient has taken over-the-counter medicine with minimal relief.   Cough Associated symptoms: rhinorrhea and wheezing (mild and intermittent)   Sore Throat   Past Medical History:  Diagnosis Date   Allergy    Arthritis    Blood transfusion without reported diagnosis    CHF (congestive heart failure) (HCC)    COPD (chronic obstructive pulmonary disease) (HCC)    Diabetes mellitus without complication (HCC)    Hypertension     There are no problems to display for this patient.   Past Surgical History:  Procedure Laterality Date   ROTATOR CUFF REPAIR         Home Medications    Prior to Admission medications   Medication Sig Start Date End Date Taking? Authorizing Provider  allopurinol (ZYLOPRIM) 300 MG tablet  06/05/14   [provider]  amLODipine-benazepril (LOTREL) 5-20 MG per capsule  Take 1 capsule by mouth 2 (two) times daily.    [provider]  aspirin 81 MG tablet Take 81 mg by mouth daily.    [provider]  colchicine 0.6 MG tablet Take 0.6 mg by mouth daily as needed (gout flare up.).    [provider]  COVID-19 mRNA bivalent vaccine, Pfizer, (PFIZER COVID-19 VAC BIVALENT) injection Inject into the muscle. 03/14/21   Judyann Munson, MD  COVID-19 mRNA Vac-TriS, Pfizer, (PFIZER-BIONT COVID-19 VAC-TRIS) SUSP injection Inject into the muscle. 12/03/20   Judyann Munson, MD  Febuxostat (ULORIC) 80 MG TABS Take 1 tablet by mouth at bedtime.    [provider]  furosemide (LASIX) 20 MG tablet Take 20 mg by mouth daily.    [provider]  gabapentin (NEURONTIN) 300 MG capsule Take 300 mg by mouth at bedtime. 03/16/14   [provider]  GLUCOSAMINE-CHONDROITIN PO Take 2 tablets by mouth at bedtime.    [provider]  HYDROcodone-acetaminophen (NORCO/VICODIN) 5-325 MG per tablet Take 2 tablets by mouth every 4 (four) hours as needed for pain. 08/02/12   Junious Silk, PA-C  insulin glargine (LANTUS SOLOSTAR) 100 UNIT/ML injection Inject 54 Units into the skin daily with breakfast.    [provider]  lansoprazole (PREVACID) 30 MG capsule Take 30 mg by mouth daily.    [provider]  LANTUS SOLOSTAR 100 UNIT/ML Solostar Pen  03/16/14   [provider]  Multiple  Vitamin (MULTIVITAMIN WITH MINERALS) TABS Take 1 tablet by mouth daily.    [provider]  ONE TOUCH ULTRA TEST test strip  06/18/14   [provider]  predniSONE (DELTASONE) 10 MG tablet  04/01/14   [provider]  saxagliptin HCl (ONGLYZA) 2.5 MG TABS tablet Take 2.5 mg by mouth at bedtime.    [provider]  simvastatin (ZOCOR) 20 MG tablet Take 20 mg by mouth daily.    [provider]    Family History Family History  Problem Relation Age of Onset   Diabetes Mother    Heart  disease Mother    Diabetes Father    Diabetes Sister    Diabetes Brother    Heart disease Brother     Social History Social History   Tobacco Use   Smoking status: Never   Smokeless tobacco: Never  Substance Use Topics   Alcohol use: No    Alcohol/week: 0.0 standard drinks   Drug use: No     Allergies   Penicillins   Review of Systems Review of Systems  HENT:  Positive for rhinorrhea, sinus pressure and sinus pain.   Respiratory:  Positive for cough and wheezing (mild and intermittent).     Physical Exam Triage Vital Signs ED Triage Vitals  Enc Vitals Group     BP 10/05/21 1027 130/72     Pulse Rate 10/05/21 1027 86     Resp 10/05/21 1027 17     Temp 10/05/21 1027 98.6 F (37 C)     Temp Source 10/05/21 1027 Oral     SpO2 10/05/21 1027 97 %     Weight 10/05/21 1026 274 lb 7.6 oz (124.5 kg)     Height 10/05/21 1026 5' 10.5" (1.791 m)     Head Circumference --      Peak Flow --      Pain Score 10/05/21 1026 0     Pain Loc --      Pain Edu? --      Excl. in GC? --    No data found.  Updated Vital Signs BP 130/72 (BP Location: Left Arm)   Pulse 86   Temp 98.6 F (37 C) (Oral)   Resp 17   Ht 5' 10.5" (1.791 m)   Wt 274 lb 7.6 oz (124.5 kg)   SpO2 97%   BMI 38.83 kg/m   Visual Acuity Right Eye Distance:   Left Eye Distance:   Bilateral Distance:    Right Eye Near:   Left Eye Near:    Bilateral Near:     Physical Exam Constitutional:      Appearance: He is well-developed.  HENT:     Right Ear: Tympanic membrane and ear canal normal.     Left Ear: Tympanic membrane and ear canal normal.     Mouth/Throat:     Mouth: Mucous membranes are moist.     Pharynx: Oropharynx is clear. No pharyngeal swelling or oropharyngeal exudate.  Cardiovascular:     Rate and Rhythm: Normal rate and regular rhythm.     Heart sounds: Normal heart sounds.  Pulmonary:     Effort: Pulmonary effort is normal.     Breath sounds: Normal air entry. Wheezing (mild  expiratory bilat) present. No rhonchi or rales.  Lymphadenopathy:     Cervical: No cervical adenopathy.  Neurological:     Mental Status: He is alert.     UC Treatments / Results  Labs (all labs ordered are  listed, but only abnormal results are displayed) Labs Reviewed - No data to display  EKG   Radiology No results found.  Procedures Procedures (including critical care time)  Medications Ordered in UC Medications  albuterol (VENTOLIN HFA) 108 (90 Base) MCG/ACT inhaler 2 puff (2 puffs Inhalation Given 10/05/21 1047)    Initial Impression / Assessment and Plan / UC Course  I have reviewed the triage vital signs and the nursing notes.  Pertinent labs & imaging results that were available during my care of the patient were reviewed by me and considered in my medical decision making (see chart for details).    Plan: 1.  Patient advised to continue Flonase 2 sprays each nostril once a day to help decrease the congestion. 2.  Patient advised to continue taking Mucinex DM to help reduce the cough. 3.  Patient advised to use the albuterol inhaler 2 puffs every 4-6 hours as needed for wheezing and chest congestion. 4.  Patient advised to follow-up with PCP or return to urgent care if symptoms fail to improve in the next 5 to 7 days. Final Clinical Impressions(s) / UC Diagnoses   Final diagnoses:  Bronchitis  Viral URI with cough  Nasal congestion  Wheezing     Discharge Instructions      Advised to use Flonase nasal spray 2 sprays each nostril once a day to help decrease the congestion. Advised to use Mucinex DM to help control the cough. Advised to use the albuterol inhaler 2 puffs every 4-6 hours as needed for chest congestion and wheezing. Increase fluid intake, watch glucose levels, and follow-up with PCP or return to urgent care for evaluation if symptoms fail to improve over the next 5 to 7 days.    ED Prescriptions   None    PDMP not reviewed this  encounter.   Ellsworth Lennox, PA-C 10/05/21 1053

## 2021-10-09 DIAGNOSIS — R059 Cough, unspecified: Secondary | ICD-10-CM | POA: Diagnosis not present

## 2021-10-09 DIAGNOSIS — E1122 Type 2 diabetes mellitus with diabetic chronic kidney disease: Secondary | ICD-10-CM | POA: Diagnosis not present

## 2021-10-09 DIAGNOSIS — J069 Acute upper respiratory infection, unspecified: Secondary | ICD-10-CM | POA: Diagnosis not present

## 2021-11-08 DIAGNOSIS — E1122 Type 2 diabetes mellitus with diabetic chronic kidney disease: Secondary | ICD-10-CM | POA: Diagnosis not present

## 2021-11-08 DIAGNOSIS — N183 Chronic kidney disease, stage 3 unspecified: Secondary | ICD-10-CM | POA: Diagnosis not present

## 2021-11-08 DIAGNOSIS — E669 Obesity, unspecified: Secondary | ICD-10-CM | POA: Diagnosis not present

## 2021-11-08 DIAGNOSIS — I129 Hypertensive chronic kidney disease with stage 1 through stage 4 chronic kidney disease, or unspecified chronic kidney disease: Secondary | ICD-10-CM | POA: Diagnosis not present

## 2021-11-08 DIAGNOSIS — E782 Mixed hyperlipidemia: Secondary | ICD-10-CM | POA: Diagnosis not present

## 2021-12-09 DIAGNOSIS — N1832 Chronic kidney disease, stage 3b: Secondary | ICD-10-CM | POA: Diagnosis not present

## 2021-12-09 DIAGNOSIS — I10 Essential (primary) hypertension: Secondary | ICD-10-CM | POA: Diagnosis not present

## 2021-12-09 DIAGNOSIS — D631 Anemia in chronic kidney disease: Secondary | ICD-10-CM | POA: Diagnosis not present

## 2021-12-09 DIAGNOSIS — N183 Chronic kidney disease, stage 3 unspecified: Secondary | ICD-10-CM | POA: Diagnosis not present

## 2021-12-09 DIAGNOSIS — N2581 Secondary hyperparathyroidism of renal origin: Secondary | ICD-10-CM | POA: Diagnosis not present

## 2021-12-13 ENCOUNTER — Other Ambulatory Visit (HOSPITAL_BASED_OUTPATIENT_CLINIC_OR_DEPARTMENT_OTHER): Payer: Self-pay

## 2022-02-13 DIAGNOSIS — M65321 Trigger finger, right index finger: Secondary | ICD-10-CM | POA: Diagnosis not present

## 2022-02-13 DIAGNOSIS — M109 Gout, unspecified: Secondary | ICD-10-CM | POA: Diagnosis not present

## 2022-02-13 DIAGNOSIS — E1122 Type 2 diabetes mellitus with diabetic chronic kidney disease: Secondary | ICD-10-CM | POA: Diagnosis not present

## 2022-02-13 DIAGNOSIS — N1832 Chronic kidney disease, stage 3b: Secondary | ICD-10-CM | POA: Diagnosis not present

## 2022-05-19 DIAGNOSIS — M109 Gout, unspecified: Secondary | ICD-10-CM | POA: Diagnosis not present

## 2022-05-19 DIAGNOSIS — I129 Hypertensive chronic kidney disease with stage 1 through stage 4 chronic kidney disease, or unspecified chronic kidney disease: Secondary | ICD-10-CM | POA: Diagnosis not present

## 2022-05-19 DIAGNOSIS — N183 Chronic kidney disease, stage 3 unspecified: Secondary | ICD-10-CM | POA: Diagnosis not present

## 2022-05-19 DIAGNOSIS — E1122 Type 2 diabetes mellitus with diabetic chronic kidney disease: Secondary | ICD-10-CM | POA: Diagnosis not present

## 2022-05-19 DIAGNOSIS — E782 Mixed hyperlipidemia: Secondary | ICD-10-CM | POA: Diagnosis not present

## 2022-05-19 DIAGNOSIS — Z125 Encounter for screening for malignant neoplasm of prostate: Secondary | ICD-10-CM | POA: Diagnosis not present

## 2022-05-19 DIAGNOSIS — Z0000 Encounter for general adult medical examination without abnormal findings: Secondary | ICD-10-CM | POA: Diagnosis not present

## 2022-05-20 DIAGNOSIS — E875 Hyperkalemia: Secondary | ICD-10-CM | POA: Diagnosis not present

## 2022-05-20 DIAGNOSIS — I10 Essential (primary) hypertension: Secondary | ICD-10-CM | POA: Diagnosis not present

## 2022-05-20 DIAGNOSIS — N1832 Chronic kidney disease, stage 3b: Secondary | ICD-10-CM | POA: Diagnosis not present

## 2022-05-20 DIAGNOSIS — E1129 Type 2 diabetes mellitus with other diabetic kidney complication: Secondary | ICD-10-CM | POA: Diagnosis not present

## 2022-06-26 DIAGNOSIS — M25512 Pain in left shoulder: Secondary | ICD-10-CM | POA: Diagnosis not present

## 2022-07-08 DIAGNOSIS — M25512 Pain in left shoulder: Secondary | ICD-10-CM | POA: Diagnosis not present

## 2022-07-14 DIAGNOSIS — M75122 Complete rotator cuff tear or rupture of left shoulder, not specified as traumatic: Secondary | ICD-10-CM | POA: Diagnosis not present

## 2022-07-14 DIAGNOSIS — S43432D Superior glenoid labrum lesion of left shoulder, subsequent encounter: Secondary | ICD-10-CM | POA: Diagnosis not present

## 2022-08-15 DIAGNOSIS — E1122 Type 2 diabetes mellitus with diabetic chronic kidney disease: Secondary | ICD-10-CM | POA: Diagnosis not present

## 2022-08-15 DIAGNOSIS — M109 Gout, unspecified: Secondary | ICD-10-CM | POA: Diagnosis not present

## 2022-08-15 DIAGNOSIS — N1832 Chronic kidney disease, stage 3b: Secondary | ICD-10-CM | POA: Diagnosis not present

## 2022-08-15 DIAGNOSIS — M65321 Trigger finger, right index finger: Secondary | ICD-10-CM | POA: Diagnosis not present

## 2022-08-19 DIAGNOSIS — G8918 Other acute postprocedural pain: Secondary | ICD-10-CM | POA: Diagnosis not present

## 2022-08-19 DIAGNOSIS — M11812 Other specified crystal arthropathies, left shoulder: Secondary | ICD-10-CM | POA: Diagnosis not present

## 2022-08-19 DIAGNOSIS — M19012 Primary osteoarthritis, left shoulder: Secondary | ICD-10-CM | POA: Diagnosis not present

## 2022-08-19 DIAGNOSIS — M75122 Complete rotator cuff tear or rupture of left shoulder, not specified as traumatic: Secondary | ICD-10-CM | POA: Diagnosis not present

## 2022-08-19 DIAGNOSIS — M7542 Impingement syndrome of left shoulder: Secondary | ICD-10-CM | POA: Diagnosis not present

## 2022-08-26 DIAGNOSIS — M25512 Pain in left shoulder: Secondary | ICD-10-CM | POA: Diagnosis not present

## 2022-08-29 DIAGNOSIS — M25512 Pain in left shoulder: Secondary | ICD-10-CM | POA: Diagnosis not present

## 2022-09-01 DIAGNOSIS — Z4789 Encounter for other orthopedic aftercare: Secondary | ICD-10-CM | POA: Diagnosis not present

## 2022-09-02 DIAGNOSIS — M25512 Pain in left shoulder: Secondary | ICD-10-CM | POA: Diagnosis not present

## 2022-09-05 DIAGNOSIS — M25512 Pain in left shoulder: Secondary | ICD-10-CM | POA: Diagnosis not present

## 2022-09-08 DIAGNOSIS — M25512 Pain in left shoulder: Secondary | ICD-10-CM | POA: Diagnosis not present

## 2022-09-11 DIAGNOSIS — M25512 Pain in left shoulder: Secondary | ICD-10-CM | POA: Diagnosis not present

## 2022-09-15 DIAGNOSIS — M25512 Pain in left shoulder: Secondary | ICD-10-CM | POA: Diagnosis not present

## 2022-09-18 DIAGNOSIS — M25512 Pain in left shoulder: Secondary | ICD-10-CM | POA: Diagnosis not present

## 2022-09-24 DIAGNOSIS — M25512 Pain in left shoulder: Secondary | ICD-10-CM | POA: Diagnosis not present

## 2022-09-26 DIAGNOSIS — M25512 Pain in left shoulder: Secondary | ICD-10-CM | POA: Diagnosis not present

## 2022-10-01 DIAGNOSIS — E1122 Type 2 diabetes mellitus with diabetic chronic kidney disease: Secondary | ICD-10-CM | POA: Diagnosis not present

## 2022-10-01 DIAGNOSIS — L98499 Non-pressure chronic ulcer of skin of other sites with unspecified severity: Secondary | ICD-10-CM | POA: Diagnosis not present

## 2022-10-02 DIAGNOSIS — M25512 Pain in left shoulder: Secondary | ICD-10-CM | POA: Diagnosis not present

## 2022-10-09 DIAGNOSIS — M25512 Pain in left shoulder: Secondary | ICD-10-CM | POA: Diagnosis not present

## 2022-10-14 DIAGNOSIS — M25512 Pain in left shoulder: Secondary | ICD-10-CM | POA: Diagnosis not present

## 2022-10-17 DIAGNOSIS — M25512 Pain in left shoulder: Secondary | ICD-10-CM | POA: Diagnosis not present

## 2022-10-22 DIAGNOSIS — M25512 Pain in left shoulder: Secondary | ICD-10-CM | POA: Diagnosis not present

## 2022-10-24 DIAGNOSIS — M25512 Pain in left shoulder: Secondary | ICD-10-CM | POA: Diagnosis not present

## 2022-10-28 DIAGNOSIS — M25512 Pain in left shoulder: Secondary | ICD-10-CM | POA: Diagnosis not present

## 2022-10-30 DIAGNOSIS — M25512 Pain in left shoulder: Secondary | ICD-10-CM | POA: Diagnosis not present

## 2022-11-05 DIAGNOSIS — M25512 Pain in left shoulder: Secondary | ICD-10-CM | POA: Diagnosis not present

## 2022-11-07 DIAGNOSIS — M25512 Pain in left shoulder: Secondary | ICD-10-CM | POA: Diagnosis not present

## 2022-11-17 DIAGNOSIS — N183 Chronic kidney disease, stage 3 unspecified: Secondary | ICD-10-CM | POA: Diagnosis not present

## 2022-11-17 DIAGNOSIS — E1122 Type 2 diabetes mellitus with diabetic chronic kidney disease: Secondary | ICD-10-CM | POA: Diagnosis not present

## 2022-11-17 DIAGNOSIS — I129 Hypertensive chronic kidney disease with stage 1 through stage 4 chronic kidney disease, or unspecified chronic kidney disease: Secondary | ICD-10-CM | POA: Diagnosis not present

## 2022-11-17 DIAGNOSIS — E782 Mixed hyperlipidemia: Secondary | ICD-10-CM | POA: Diagnosis not present

## 2022-11-18 DIAGNOSIS — N1832 Chronic kidney disease, stage 3b: Secondary | ICD-10-CM | POA: Diagnosis not present

## 2022-11-18 DIAGNOSIS — I10 Essential (primary) hypertension: Secondary | ICD-10-CM | POA: Diagnosis not present

## 2022-11-18 DIAGNOSIS — E875 Hyperkalemia: Secondary | ICD-10-CM | POA: Diagnosis not present

## 2022-11-18 DIAGNOSIS — E1122 Type 2 diabetes mellitus with diabetic chronic kidney disease: Secondary | ICD-10-CM | POA: Diagnosis not present

## 2023-03-20 DIAGNOSIS — M65322 Trigger finger, left index finger: Secondary | ICD-10-CM | POA: Diagnosis not present

## 2023-03-20 DIAGNOSIS — M65341 Trigger finger, right ring finger: Secondary | ICD-10-CM | POA: Diagnosis not present

## 2023-05-12 DIAGNOSIS — M25531 Pain in right wrist: Secondary | ICD-10-CM | POA: Diagnosis not present

## 2024-02-16 ENCOUNTER — Other Ambulatory Visit (HOSPITAL_BASED_OUTPATIENT_CLINIC_OR_DEPARTMENT_OTHER): Payer: Self-pay

## 2024-02-16 MED ORDER — COMIRNATY 30 MCG/0.3ML IM SUSY
0.3000 mL | PREFILLED_SYRINGE | Freq: Once | INTRAMUSCULAR | 0 refills | Status: AC
  Filled 2024-02-16: qty 0.3, 1d supply, fill #0
